# Patient Record
Sex: Male | Born: 1970 | Race: Black or African American | Hispanic: No | Marital: Single | Smoking: Current every day smoker
Health system: Southern US, Community
[De-identification: ages and names within clinical notes are randomized; demographics above are authoritative.]

---

## 2004-09-16 ENCOUNTER — Emergency Department (HOSPITAL_COMMUNITY): Admission: EM | Admit: 2004-09-16 | Discharge: 2004-09-16 | Payer: Self-pay | Admitting: Emergency Medicine

## 2015-05-20 ENCOUNTER — Encounter (HOSPITAL_COMMUNITY): Payer: Self-pay | Admitting: Emergency Medicine

## 2015-05-20 ENCOUNTER — Emergency Department (HOSPITAL_COMMUNITY): Payer: Self-pay

## 2015-05-20 ENCOUNTER — Emergency Department (HOSPITAL_COMMUNITY)
Admission: EM | Admit: 2015-05-20 | Discharge: 2015-05-20 | Disposition: A | Discharge status: Home / Self Care | Payer: Self-pay | Attending: Physician Assistant | Admitting: Physician Assistant

## 2015-05-20 DIAGNOSIS — R231 Pallor: Secondary | ICD-10-CM | POA: Insufficient documentation

## 2015-05-20 DIAGNOSIS — R000 Tachycardia, unspecified: Secondary | ICD-10-CM | POA: Insufficient documentation

## 2015-05-20 DIAGNOSIS — B349 Viral infection, unspecified: Secondary | ICD-10-CM | POA: Insufficient documentation

## 2015-05-20 DIAGNOSIS — R42 Dizziness and giddiness: Secondary | ICD-10-CM | POA: Insufficient documentation

## 2015-05-20 DIAGNOSIS — F17200 Nicotine dependence, unspecified, uncomplicated: Secondary | ICD-10-CM | POA: Insufficient documentation

## 2015-05-20 DIAGNOSIS — N390 Urinary tract infection, site not specified: Secondary | ICD-10-CM | POA: Insufficient documentation

## 2015-05-20 LAB — COMPREHENSIVE METABOLIC PANEL
ALBUMIN: 3.6 g/dL (ref 3.5–5.0)
ALK PHOS: 73 U/L (ref 38–126)
ALT: 24 U/L (ref 17–63)
ANION GAP: 10 (ref 5–15)
AST: 19 U/L (ref 15–41)
BILIRUBIN TOTAL: 0.6 mg/dL (ref 0.3–1.2)
BUN: 8 mg/dL (ref 6–20)
CALCIUM: 9.3 mg/dL (ref 8.9–10.3)
CO2: 25 mmol/L (ref 22–32)
Chloride: 105 mmol/L (ref 101–111)
Creatinine, Ser: 1.09 mg/dL (ref 0.61–1.24)
GFR calc Af Amer: >60 mL/min (ref >60)
GLUCOSE: 93 mg/dL (ref 65–99)
POTASSIUM: 3.4 mmol/L — AB (ref 3.5–5.1)
SODIUM: 140 mmol/L (ref 135–145)
TOTAL PROTEIN: 8 g/dL (ref 6.5–8.1)

## 2015-05-20 LAB — URINE MICROSCOPIC-ADD ON

## 2015-05-20 LAB — CBC
HEMATOCRIT: 43.9 % (ref 39.0–52.0)
HEMOGLOBIN: 14.7 g/dL (ref 13.0–17.0)
MCH: 30.8 pg (ref 26.0–34.0)
MCHC: 33.5 g/dL (ref 30.0–36.0)
MCV: 91.8 fL (ref 78.0–100.0)
Platelets: 291 10*3/uL (ref 150–400)
RBC: 4.78 MIL/uL (ref 4.22–5.81)
RDW: 12.9 % (ref 11.5–15.5)
WBC: 11.6 10*3/uL — AB (ref 4.0–10.5)

## 2015-05-20 LAB — URINALYSIS, ROUTINE W REFLEX MICROSCOPIC
Glucose, UA: NEGATIVE mg/dL
Hgb urine dipstick: NEGATIVE
KETONES UR: 15 mg/dL — AB
NITRITE: POSITIVE — AB
PH: 5.5 (ref 5.0–8.0)
Protein, ur: 100 mg/dL — AB
Specific Gravity, Urine: 1.031 — ABNORMAL HIGH (ref 1.005–1.030)

## 2015-05-20 LAB — LIPASE, BLOOD: LIPASE: 28 U/L (ref 11–51)

## 2015-05-20 MED ORDER — SODIUM CHLORIDE 0.9 % IV BOLUS (SEPSIS)
1000.0000 mL | Freq: Once | INTRAVENOUS | Status: AC
  Administered 2015-05-20: 1000 mL via INTRAVENOUS

## 2015-05-20 MED ORDER — ONDANSETRON HCL 4 MG/2ML IJ SOLN
INTRAMUSCULAR | Status: AC
  Filled 2015-05-20: qty 2

## 2015-05-20 MED ORDER — ONDANSETRON 4 MG PO TBDP: 4.0000 mg | ORAL_TABLET | Freq: Three times a day (TID) | ORAL | Status: DC | PRN

## 2015-05-20 MED ORDER — CEPHALEXIN 500 MG PO CAPS: 500.0000 mg | ORAL_CAPSULE | Freq: Two times a day (BID) | ORAL | Status: DC

## 2015-05-20 MED ORDER — ONDANSETRON HCL 4 MG/2ML IJ SOLN
4.0000 mg | Freq: Once | INTRAMUSCULAR | Status: AC
  Administered 2015-05-20: 4 mg via INTRAVENOUS
  Filled 2015-05-20: qty 2

## 2015-05-20 NOTE — Discharge Instructions (Signed)
You were evaluated in the emergency room today for nausea, vomiting, dizziness, and cough. I suspect your symptoms are due to a virus. Your labs and chest x-ray are unremarkable. Your urine did show sign of urinary tract infection so I will give you a 5 day course of Keflex, an antibiotic. Please follow-up with urology (phone number in this packet) if you start experiencing any urinary symptoms even after antibiotic treatment. I will also give you a prescription for Zofran as needed for nausea. Please follow-up with your primary care provider within one week. If you do not have one you can call one of the offices listed below. Return to the ER for new or worsening symptoms.   Emergency Department Resource Guide 1) Find a Doctor and Pay Out of Pocket Although you won't have to find out who is covered by your insurance plan, it is a good idea to ask around and get recommendations. You will then need to call the office and see if the doctor you have chosen will accept you as a new patient and what types of options they offer for patients who are self-pay. Some doctors offer discounts or will set up payment plans for their patients who do not have insurance, but you will need to ask so you aren't surprised when you get to your appointment.  2) Contact Your Local Health Department Not all health departments have doctors that can see patients for sick visits, but many do, so it is worth a call to see if yours does. If you don't know where your local health department is, you can check in your phone book. The CDC also has a tool to help you locate your state's health department, and many state websites also have listings of all of their local health departments.  3) Find a Walk-in Clinic If your illness is not likely to be very severe or complicated, you may want to try a walk in clinic. These are popping up all over the country in pharmacies, drugstores, and shopping centers. They're usually staffed by nurse  practitioners or physician assistants that have been trained to treat common illnesses and complaints. They're usually fairly quick and inexpensive. However, if you have serious medical issues or chronic medical problems, these are probably not your best option.  No Primary Care Doctor: - Call Health Connect at  [phone] - they can help you locate a primary care doctor that  accepts your insurance, provides certain services, etc. - Physician Referral [phone]- [phone]  Emergency Department Resource Guide 1) Find a Doctor and Pay Out of Pocket Although you won't have to find out who is covered by your insurance plan, it is a good idea to ask around and get recommendations. You will then need to call the office and see if the doctor you have chosen will accept you as a new patient and what types of options they offer for patients who are self-pay. Some doctors offer discounts or will set up payment plans for their patients who do not have insurance, but you will need to ask so you aren't surprised when you get to your appointment.  2) Contact Your Local Health Department Not all health departments have doctors that can see patients for sick visits, but many do, so it is worth a call to see if yours does. If you don't know where your local health department is, you can check in your phone book. The CDC also has a tool to help you locate your state's health  department, and many state websites also have listings of all of their local health departments.  3) Find a [facility] If your illness is not likely to be very severe or complicated, you may want to try a walk in clinic. These are popping up all over the country in pharmacies, drugstores, and shopping centers. They're usually staffed by nurse practitioners or physician assistants that have been trained to treat common illnesses and complaints. They're usually fairly quick and inexpensive. However, if you have serious medical issues or chronic  medical problems, these are probably not your best option.  No Primary Care Doctor: - Call Health Connect at  [phone] - they can help you locate a primary care doctor that  accepts your insurance, provides certain services, etc. - Physician Referral Service- [phone]  Chronic Pain Problems: Organization         Address  Phone   Notes  [facility]  [phone] Patients need to be referred by their primary care doctor.   Medication Assistance: Organization         Address  Phone   Notes  [facility] Medication [facility] [address] [place], [place] [address] [phone] --Must be a resident of [facility] -- Must have NO insurance coverage whatsoever (no Medicaid/ Medicare, etc.) -- The pt. MUST have a primary care doctor that directs their care regularly and follows them in the community   [facility]  [phone]   [employer]  [phone]    Agencies that provide inexpensive medical care: Organization         Address  Phone   Notes  [place]  [phone]   [facility]    [phone]   [facility] [place], [place] [address] [phone]   [place] [address], [place] [phone]   [facility]    [phone]   [facility]    [phone]   [place] [address], [place] Phone:  [phone], Fax:  [phone] Hours of Operation:  9 am - 6 pm, M-F.  Also accepts Medicaid/Medicare and self-pay.  [facility] for [place] [address], [address], [place] Phone: [phone], Fax: [phone]. Hours of Operation:  8:30 am - 5:30 pm, M-F.  Also accepts Medicaid and self-pay.  [facility] [place] [address], [place] Phone: [phone]   [place], [place], [place] [phone],  Ext. [phone] Mondays & Thursdays: 7-9 AM.  First 15 patients are seen on a first come, first serve basis.    [place] Providers:  Organization         Address  Phone   Notes  [facility] [address], [place] [phone] Also accepts self-pay patients.  [facility] [address], [place]  [phone]   [place], [address], [place] [phone]   [facility] [address], [place] [phone]   Lucianne Lei [address], [place]   [phone] Only accepts [place] [facility] [place] patients after they have their name applied to their card.   Self-Pay (no insurance) in [facility]:  Organization  Address  Phone   Notes  Sickle Cell Patients, [facility] [place] [phone]   [facility] [place] [phone]   [facility] [place]  [address], [address], [place] [phone]   [facility]/Dr. Osei-Bonsu  [address] [place] or [address], [place] [phone] Phone number for both [place] and [place] locations is the same.  [facility] and [facility] [address], [place] [phone]   [facility] [address] [place] or [address] [phone]   [facility] [address], [place] [phone], phone; [phone], fax Sees patients 1st and 3rd Saturday of every month.  Must not qualify for public or private insurance (i.e. Medicaid, Medicare, [place] Health Choice, Veterans' Benefits)  Household income should be no more than 200% of the poverty level The clinic cannot treat you if you are pregnant or think you are pregnant  Sexually transmitted diseases are not  treated at the clinic.

## 2015-05-20 NOTE — ED Notes (Signed)
Pt sts N/V x 3 days with some dizziness x 2 days; pt sts unable to hold down POs and having body aches

## 2015-05-20 NOTE — ED Provider Notes (Signed)
CSN: [id number]     Arrival date & time 05/20/15  [id number] History   First MD Initiated Contact with Patient 05/20/15 1056     Chief Complaint  Patient presents with  . Emesis  . Dizziness    HPI   [name] is an 44 y.o. male with no significant PMH who presents to the ED for evaluation of N/V, dizziness, and cough. He states that starting on 11/23 he has felt very sick. States that he has had a nonproductive cough, body aches, and N/V. States he has been able to keep fluids down but has not had an appetite and has not really eaten in a few days. He states that has had 3-5 episodes of NBNB emesis a day. Denies diarrhea. States that he has felt very weak as well. He reports he was getting ready for work this AM and started feeling very dizzy. Denies syncope. Denies chest pain. He does endorse mild SOB that started today. He is maintaining good SpO2 on room air and does not appear to have labored breathing. He is unsure if he has had fever at home. Denies sick contacts. Denies abdominal pain, dysuria, urinary frequency/urgency. Admits to smoking 1/2 PPD. Denies EtOH, MJ , or other drug use. Reports he has not had his flu shot this year.  History reviewed. No pertinent past medical history. History reviewed. No pertinent past surgical history. History reviewed. No pertinent family history. Social History  Substance Use Topics  . Smoking status: Current Every Day Smoker  . Smokeless tobacco: None  . Alcohol Use: Yes    Review of Systems  All other systems reviewed and are negative.     Allergies  Review of patient's allergies indicates no known allergies.  Home Medications   Prior to Admission medications   Not on File   BP 144/105 mmHg  Pulse 102  Temp(Src) 97.9 F (36.6 C) (Oral)  Resp 18  SpO2 100% Physical Exam  Constitutional: He is oriented to person, place, and time. No distress.  HENT:  Right Ear: External ear normal.  Left Ear: External ear normal.  Nose: Nose normal.   Mouth/Throat: Oropharynx is clear and moist. Mucous membranes are dry. No oropharyngeal exudate.  Eyes: Conjunctivae and EOM are normal. Pupils are equal, round, and reactive to light.  Neck: Normal range of motion. Neck supple.  Cardiovascular: Regular rhythm, normal heart sounds and intact distal pulses.  Tachycardia present.   Pulmonary/Chest: Effort normal and breath sounds normal. No respiratory distress. He has no wheezes.  Abdominal: Soft. Bowel sounds are normal. He exhibits no distension. There is no tenderness. There is no rebound and no guarding.  Musculoskeletal: Normal range of motion. He exhibits no edema or tenderness.  Neurological: He is alert and oriented to person, place, and time. He has normal strength. No cranial nerve deficit or sensory deficit.  Skin: Skin is warm and dry. He is not diaphoretic. There is pallor.  Psychiatric: He has a normal mood and affect.  Nursing note and vitals reviewed.   ED Course  Procedures (including critical care time) Labs Review Labs Reviewed  COMPREHENSIVE METABOLIC PANEL - Abnormal; Notable for the following:    Potassium 3.4 (*)    All other components within normal limits  CBC - Abnormal; Notable for the following:    WBC 11.6 (*)    All other components within normal limits  URINALYSIS, ROUTINE W REFLEX MICROSCOPIC (NOT AT [facility]) - Abnormal; Notable for the following:    Color,  Urine ORANGE (*)    Specific Gravity, Urine 1.031 (*)    Bilirubin Urine MODERATE (*)    Ketones, ur 15 (*)    Protein, ur 100 (*)    Nitrite POSITIVE (*)    Leukocytes, UA TRACE (*)    All other components within normal limits  URINE MICROSCOPIC-ADD ON - Abnormal; Notable for the following:    Squamous Epithelial / LPF 0-5 (*)    Bacteria, UA FEW (*)    All other components within normal limits  URINE CULTURE  LIPASE, BLOOD    Imaging Review Dg Chest 2 View  05/20/2015  CLINICAL DATA:  Productive cough EXAM: CHEST  2 VIEW COMPARISON:  None.  FINDINGS: The heart size and mediastinal contours are within normal limits. Both lungs are clear. The visualized skeletal structures are unremarkable. IMPRESSION: No active cardiopulmonary disease. Electronically Signed   By: Marlan Palau M.D.   On: 05/20/2015 12:36   I have personally reviewed and evaluated these images and lab results as part of my medical decision-making.   EKG Interpretation None      MDM   Final diagnoses:  Urinary tract infection without hematuria, site unspecified  Viral syndrome    Pt initially tachycardic which I suspect is likely 2/2 hypovolemia and poor PO intake. He is afebrile in the ED today.  I suspect viral illness. I suspect pt's weakness and dizzness also 2/2 viral illness and hypovolemia/poor PO intake. However given duration of symptoms, tachycardia, and clinical presentation will get CXR to r/o pneumonia or other cardiopulmonary pathology. Will check CBC, CMP, UA. Started on 1L bolus and zofran. BP initially high to 144/105 but during our exam in the room BP down to 130s/70s.  HR down to 80s after liter bolus. Pt slightly hypertensive again. No e/o end organ damage. Instructed to f/u with PCP. In the ED he states he feels much improved. K+ slightly low at 3.4 which I suspect is secondary to GI losses. White count slightly high at 11.6. UA does show postiive nitrites and trace leuks but pt denies any dysuria, urinary frequency/urgency, penile discharge, GU pain. Will give 5 day course of Keflex. Will give info for uro f/u. Will send urine for culture. ER return precautions given.   Carlene Coria, PA-C 05/21/15 1610  Courteney Randall An, MD 05/22/15 [phone]

## 2015-05-21 LAB — URINE CULTURE: CULTURE: NO GROWTH

## 2015-08-04 ENCOUNTER — Encounter (HOSPITAL_COMMUNITY): Payer: Self-pay | Admitting: *Deleted

## 2015-08-04 ENCOUNTER — Emergency Department (HOSPITAL_COMMUNITY)
Admission: EM | Admit: 2015-08-04 | Discharge: 2015-08-04 | Disposition: A | Discharge status: Home / Self Care | Payer: Self-pay | Attending: Emergency Medicine | Admitting: Emergency Medicine

## 2015-08-04 ENCOUNTER — Emergency Department (HOSPITAL_COMMUNITY): Payer: Self-pay

## 2015-08-04 DIAGNOSIS — Y9289 Other specified places as the place of occurrence of the external cause: Secondary | ICD-10-CM | POA: Insufficient documentation

## 2015-08-04 DIAGNOSIS — Z23 Encounter for immunization: Secondary | ICD-10-CM | POA: Insufficient documentation

## 2015-08-04 DIAGNOSIS — F17200 Nicotine dependence, unspecified, uncomplicated: Secondary | ICD-10-CM | POA: Insufficient documentation

## 2015-08-04 DIAGNOSIS — S99922A Unspecified injury of left foot, initial encounter: Secondary | ICD-10-CM | POA: Insufficient documentation

## 2015-08-04 DIAGNOSIS — W450XXA Nail entering through skin, initial encounter: Secondary | ICD-10-CM | POA: Insufficient documentation

## 2015-08-04 DIAGNOSIS — Y998 Other external cause status: Secondary | ICD-10-CM | POA: Insufficient documentation

## 2015-08-04 DIAGNOSIS — Y9389 Activity, other specified: Secondary | ICD-10-CM | POA: Insufficient documentation

## 2015-08-04 LAB — CBG MONITORING, ED: Glucose-Capillary: 92 mg/dL (ref 65–99)

## 2015-08-04 MED ORDER — CIPROFLOXACIN HCL 500 MG PO TABS: 500.0000 mg | ORAL_TABLET | Freq: Two times a day (BID) | ORAL | Status: DC

## 2015-08-04 MED ORDER — TETANUS-DIPHTH-ACELL PERTUSSIS 5-2.5-18.5 LF-MCG/0.5 IM SUSP
0.5000 mL | Freq: Once | INTRAMUSCULAR | Status: AC
  Administered 2015-08-04: 0.5 mL via INTRAMUSCULAR
  Filled 2015-08-04: qty 0.5

## 2015-08-04 NOTE — ED Notes (Signed)
Pt reports he stepped on a nail 2 weeks ago. Pt has a darkend skin area on the ball of LT foot.

## 2015-08-04 NOTE — Discharge Instructions (Signed)
Is important for you to take all of your antibiotics as prescribed. Do not save or share them. Please follow-up with your doctor in 1 week for a wound recheck. Return to ED for any new or worsening symptoms.

## 2015-08-04 NOTE — ED Notes (Signed)
Declined W/C at D/C and was escorted to lobby by RN. 

## 2015-08-04 NOTE — ED Provider Notes (Signed)
CSN: [id number]     Arrival date & time 08/04/15  1212 History  By signing my name below, I, Iona Beard, attest that this documentation has been prepared under the direction and in the presence of Velna Hatchet, PA-C.  Electronically Signed: Iona Beard, ED Scribe 08/04/2015 at 2:24 PM.    Chief Complaint  Patient presents with  . Foot Injury    The history is provided by the patient. No language interpreter was used.   HPI Comments: Todd Christian is a 45 y.o. male who presents to the Emergency Department complaining of sudden onset, constant foot pain, onset after he stepped on a nail a few weeks ago at work. Pt states that the nail did not go through his foot but did puncture his foot. He notes that the area is still mildly painful and has noticed his skin darkening. No worsening or alleviating factors noted. Pt denies SOB, jaw pain, neck pain, fever, chills, and any other associated symptoms.    History reviewed. No pertinent past medical history. History reviewed. No pertinent past surgical history. History reviewed. No pertinent family history. Social History  Substance Use Topics  . Smoking status: Current Every Day Smoker  . Smokeless tobacco: None  . Alcohol Use: Yes    Review of Systems  Constitutional: Negative for chills.  Respiratory: Negative for shortness of breath.   Musculoskeletal: Negative for gait problem and neck stiffness.  Skin: Positive for color change and wound.  Neurological: Negative for dizziness and headaches.  All other systems reviewed and are negative.    Allergies  Review of patient's allergies indicates no known allergies.  Home Medications   Prior to Admission medications   Medication Sig Start Date End Date Taking? Authorizing Provider  cephALEXin (KEFLEX) 500 MG capsule Take 1 capsule (500 mg total) by mouth 2 (two) times daily. 05/20/15  Yes Ace Gins Sam, PA-C  ondansetron (ZOFRAN ODT) 4 MG disintegrating tablet Take 1  tablet (4 mg total) by mouth every 8 (eight) hours as needed for nausea or vomiting. 05/20/15  Yes Ace Gins Sam, PA-C  ciprofloxacin (CIPRO) 500 MG tablet Take 1 tablet (500 mg total) by mouth 2 (two) times daily. 08/04/15   Joycie Peek, PA-C   BP 129/85 mmHg  Pulse 77  Temp(Src) 98.2 F (36.8 C) (Oral)  Ht 5' 9.5" (1.765 m)  Wt 153 lb 8 oz (69.627 kg)  BMI 22.35 kg/m2  SpO2 100% Physical Exam  Constitutional: He appears well-developed and well-nourished. No distress.  HENT:  Head: Normocephalic and atraumatic.  Eyes: Conjunctivae and EOM are normal.  Neck: Neck supple. No tracheal deviation present.  Cardiovascular: Normal rate, regular rhythm and normal heart sounds.   Pulmonary/Chest: Effort normal. No respiratory distress.  Musculoskeletal: Normal range of motion. He exhibits tenderness.       Left ankle: He exhibits no swelling, no deformity, no laceration and normal pulse. Tenderness.  Plantar aspect of left foot contains area of callused skin with mildly darker skin pigmentation, .5 cm in lenth.  Mild TTP to area.  Distal pulses intact. No other swelling or erythema noted.   Neurological: He is alert.  Gait baseline  Skin: Skin is warm and dry.  Psychiatric: He has a normal mood and affect. His behavior is normal.    ED Course  Procedures (including critical care time) DIAGNOSTIC STUDIES: Oxygen Saturation is 100% on RA, normal by my interpretation.    COORDINATION OF CARE: 1:33 PM-Discussed treatment plan which includes Tdap injection  and DG left foot complete with pt at bedside and pt agreed to plan.   Labs Review Labs Reviewed  CBG MONITORING, ED    Imaging Review Dg Foot Complete Left  08/04/2015  CLINICAL DATA:  Sudden onset of pain after stepping on a nail several weeks ago at work. EXAM: LEFT FOOT - COMPLETE 3+ VIEW COMPARISON:  None. FINDINGS: There is no evidence of fracture or dislocation. There is no evidence of arthropathy or other focal bone  abnormality. Soft tissues are unremarkable. No radiopaque foreign object. IMPRESSION: Normal radiographs. Electronically Signed   By: Paulina Fusi M.D.   On: 08/04/2015 14:17   I have personally reviewed and evaluated these images and lab results as part of my medical decision-making.   EKG Interpretation None     Meds given in ED:  Medications  Tdap (BOOSTRIX) injection 0.5 mL (0.5 mLs Intramuscular Given 08/04/15 1501)    Discharge Medication List as of 08/04/2015  2:47 PM    START taking these medications   Details  ciprofloxacin (CIPRO) 500 MG tablet Take 1 tablet (500 mg total) by mouth 2 (two) times daily., Starting 08/04/2015, Until Discontinued, Print       Filed Vitals:   08/04/15 1304  BP: 129/85  Pulse: 77  Temp: 98.2 F (36.8 C)  TempSrc: Oral  Height: 5' 9.5" (1.765 m)  Weight: 69.627 kg  SpO2: 100%    MDM  Todd Christian is a 45 y.o. male with no significant past medical history comes in for evaluation after stepping on a nail 2 weeks ago. Nail went through his shoe. He reports only minor penetration into his foot. Tetanus updated in the ED. No evidence of infection or other cellulitis on exam. However, we will empirically initiate Cipro. Follow up with PCP for wound recheck in 3 or 4 days. Patient verbalizes understanding and agrees with this plan. Pulse is no other questions or concerns at this time. Hemodynamically stable, afebrile and appropriate for discharge. Ambulates out of the ED without difficulty. Final diagnoses:  Foot injury, left, initial encounter   I personally performed the services described in this documentation, which was scribed in my presence. The recorded information has been reviewed and is accurate.    Joycie Peek, PA-C 08/04/15 1545  Arby Barrette, MD 08/06/15 1700

## 2015-09-22 ENCOUNTER — Emergency Department (HOSPITAL_COMMUNITY)
Admission: EM | Admit: 2015-09-22 | Discharge: 2015-09-22 | Disposition: A | Discharge status: Home / Self Care | Payer: Self-pay | Attending: Emergency Medicine | Admitting: Emergency Medicine

## 2015-09-22 ENCOUNTER — Encounter (HOSPITAL_COMMUNITY): Payer: Self-pay | Admitting: *Deleted

## 2015-09-22 ENCOUNTER — Emergency Department (HOSPITAL_COMMUNITY): Payer: Self-pay

## 2015-09-22 DIAGNOSIS — Z792 Long term (current) use of antibiotics: Secondary | ICD-10-CM | POA: Insufficient documentation

## 2015-09-22 DIAGNOSIS — F17200 Nicotine dependence, unspecified, uncomplicated: Secondary | ICD-10-CM | POA: Insufficient documentation

## 2015-09-22 DIAGNOSIS — L03011 Cellulitis of right finger: Secondary | ICD-10-CM | POA: Insufficient documentation

## 2015-09-22 MED ORDER — LIDOCAINE HCL (PF) 2 % IJ SOLN: 0.0000 mL | Freq: Once | INTRAMUSCULAR | Status: DC | PRN

## 2015-09-22 MED ORDER — SULFAMETHOXAZOLE-TRIMETHOPRIM 800-160 MG PO TABS: 1.0000 | ORAL_TABLET | Freq: Two times a day (BID) | ORAL | Status: AC

## 2015-09-22 MED ORDER — CEPHALEXIN 500 MG PO CAPS: 500.0000 mg | ORAL_CAPSULE | Freq: Two times a day (BID) | ORAL | Status: DC

## 2015-09-22 MED ORDER — LIDOCAINE HCL (PF) 1 % IJ SOLN
5.0000 mL | Freq: Once | INTRAMUSCULAR | Status: AC
  Administered 2015-09-22: 5 mL via INTRADERMAL
  Filled 2015-09-22: qty 5

## 2015-09-22 NOTE — ED Provider Notes (Signed)
CSN: [id number]     Arrival date & time 09/22/15  1509 History  By signing my name below, I, Todd Christian, attest that this documentation has been prepared under the direction and in the presence of [employer], PA-C. Electronically Signed: Tanda Christian, ED Scribe. 09/22/2015. 3:52 PM.   Chief Complaint  Patient presents with  . Finger Injury   The history is provided by the patient. No language interpreter was used.     HPI Comments: Todd Christian is a 45 y.o. male who is left hand dominant, presents to the Emergency Department complaining of gradual onset, constant, worsening, pain and swelling to right index finger x 1 week. Pt reports that he may have cut his right index finger on a box at work last week but is unsure. He has not taken anything for the pain. Denies weakness, numbness, tingling, fever, chills, or any other associated symptoms.    History reviewed. No pertinent past medical history. History reviewed. No pertinent past surgical history. History reviewed. No pertinent family history. Social History  Substance Use Topics  . Smoking status: Current Every Day Smoker  . Smokeless tobacco: None  . Alcohol Use: Yes    Review of Systems  Constitutional: Negative for fever and chills.  Musculoskeletal: Positive for joint swelling and arthralgias (right index finger).  Neurological: Negative for weakness and numbness.   Allergies  Review of patient's allergies indicates no known allergies.  Home Medications   Prior to Admission medications   Medication Sig Start Date End Date Taking? Authorizing Provider  cephALEXin (KEFLEX) 500 MG capsule Take 1 capsule (500 mg total) by mouth 2 (two) times daily. 09/22/15   Jermey Closs Patel-Mills, PA-C  ciprofloxacin (CIPRO) 500 MG tablet Take 1 tablet (500 mg total) by mouth 2 (two) times daily. 08/04/15   Joycie Peek, PA-C  ondansetron (ZOFRAN ODT) 4 MG disintegrating tablet Take 1 tablet (4 mg total) by mouth every 8 (eight)  hours as needed for nausea or vomiting. 05/20/15   Ace Gins Sam, PA-C  sulfamethoxazole-trimethoprim (BACTRIM DS,SEPTRA DS) 800-160 MG tablet Take 1 tablet by mouth 2 (two) times daily. 09/22/15 09/29/15  Shonique Pelphrey Patel-Mills, PA-C   BP 156/106 mmHg  Pulse 62  Temp(Src) 98.2 F (36.8 C) (Oral)  Resp 16  Ht  (1.753 m)  Wt 74.844 kg  BMI 24.36 kg/m2  SpO2 100%   Physical Exam  Constitutional: He is oriented to person, place, and time. He appears well-developed and well-nourished. No distress.  HENT:  Head: Normocephalic and atraumatic.  Eyes: Conjunctivae and EOM are normal.  Neck: Neck supple. No tracheal deviation present.  Cardiovascular: Normal rate.   Pulmonary/Chest: Effort normal. No respiratory distress.  Musculoskeletal: Normal range of motion. He exhibits edema and tenderness.  Swelling and tenderness to the right distal finger along the nailbed. No fluctuance. No subungual hematoma.   Neurological: He is alert and oriented to person, place, and time.  Skin: Skin is warm and dry.  Psychiatric: He has a normal mood and affect. His behavior is normal.  Nursing note and vitals reviewed.   ED Course  Procedures (including critical care time) NERVE BLOCK Performed by: Catha Gosselin Consent: Verbal consent obtained. Required items: required blood products, implants, devices, and special equipment available Time out: Immediately prior to procedure a "time out" was called to verify the correct patient, procedure, equipment, support staff and site/side marked as required. Indication: comfort and I&D Nerve block body site: right index finger Preparation: Patient was prepped and  draped in the usual sterile fashion. Needle gauge: 25 G Location technique: anatomical landmarks Local anesthetic: lidocaine 1% without epinepherine Anesthetic total: 3 ml Outcome: pain improved Patient tolerance: Patient tolerated the procedure well with no immediate complications.   INCISION  AND DRAINAGE Performed by: Catha GosselinPatel-Mills, Lavert Matousek Consent: Verbal consent obtained. Risks and benefits: risks, benefits and alternatives were discussed Type: abscess Body area: right index finger Anesthesia: local infiltration Incision was made with an 11 blade scalpel. Local anesthetic: digital block Complexity: simple Drainage: blood without purulent Drainage amount: 1mL Packing material: none Patient tolerance: Patient tolerated the procedure well with no immediate complications.    DIAGNOSTIC STUDIES: Oxygen Saturation is 100% on RA, normal by my interpretation.    COORDINATION OF CARE: 3:50 PM-Discussed treatment plan which includes DG R Index finger with pt at bedside and pt agreed to plan.   Labs Review Labs Reviewed - No data to display  Imaging Review Dg Finger Index Right  09/22/2015  CLINICAL DATA:  45 year old male with swollen finger in after pinching finger at work 1 week ago. EXAM: RIGHT INDEX FINGER 2+V COMPARISON:  None. FINDINGS: The finger is not marked although appears to be to be the index finger. Clinical correlation recommended. Focal lucency tuft of finger without adjacent fracture fragment noted. This may represent osteolysis from trauma. IMPRESSION: The finger is not marked although appears to be to be the index finger. Clinical correlation recommended. Focal lucency tuft of finger without adjacent fracture fragment noted. This may represent osteolysis from trauma. Electronically Signed   By: Lacy DuverneySteven  Olson M.D.   On: 09/22/2015 16:25   I have personally reviewed and evaluated these images as part of my medical decision-making.   EKG Interpretation None      MDM   Final diagnoses:  Paronychia of finger of right hand  Patient presents for right index finger pain near nailbed.  He has tenderness and swelling with discoloration.  I believe this is a paronychia.  He states he cut the finger previously but denies biting his nails.  He has been picking at the  wound.  Xray of finger shows local lucency atturft of finger without fracture.  May be osteolysis from trauma.   The wound was I&D'd but without purulent drainage.  Patient was put on antibiotics.  I discussed follow up with hand as well as strict return precautions.  Patient agrees with plan. Filed Vitals:   09/22/15 1528 09/22/15 1702  BP: 113/71 156/106  Pulse: 67 62  Temp: 98.2 F (36.8 C)   Resp: 16 16    I personally performed the services described in this documentation, which was scribed in my presence. The recorded information has been reviewed and is accurate.      Catha GosselinHanna Patel-Mills, PA-C 09/23/15 1321  Laurence Spatesachel Morgan Little, MD 09/24/15 [phone]

## 2015-09-22 NOTE — ED Notes (Signed)
Declined W/C at D/C and was escorted to lobby by RN. 

## 2015-09-22 NOTE — ED Notes (Signed)
PT reports he may have cut his Rt index finger on a box at work last week. Pt reports swelling to finger worse over night.

## 2015-09-22 NOTE — Discharge Instructions (Signed)
Paronychia Return for increased swelling or drainage. Paronychia is an infection of the skin that surrounds a nail. It usually affects the skin around a fingernail, but it may also occur near a toenail. It often causes pain and swelling around the nail. This condition may come on suddenly or develop over a longer period. In some cases, a collection of pus (abscess) can form near or under the nail. Usually, paronychia is not serious and it clears up with treatment. CAUSES This condition may be caused by bacteria or fungi. It is commonly caused by either Streptococcus or Staphylococcus bacteria. The bacteria or fungi often cause the infection by getting into the affected area through an opening in the skin, such as a cut or a hangnail. RISK FACTORS This condition is more likely to develop in:  People who get their hands wet often, such as those who work as Fish farm manager, bartenders, or nurses.  People who bite their fingernails or suck their thumbs.  People who trim their nails too short.  People who have hangnails or injured fingertips.  People who get manicures.  People who have diabetes. SYMPTOMS Symptoms of this condition include:  Redness and swelling of the skin near the nail.  Tenderness around the nail when you touch the area.  Pus-filled bumps under the cuticle. The cuticle is the skin at the base or sides of the nail.  Fluid or pus under the nail.  Throbbing pain in the area. DIAGNOSIS This condition is usually diagnosed with a physical exam. In some cases, a sample of pus may be taken from an abscess to be tested in a lab. This can help to determine what type of bacteria or fungi is causing the condition. TREATMENT Treatment for this condition depends on the cause and severity of the condition. If the condition is mild, it may clear up on its own in a few days. Your health care provider may recommend soaking the affected area in warm water a few times a day. When treatment is  needed, the options may include:  Antibiotic medicine, if the condition is caused by a bacterial infection.  Antifungal medicine, if the condition is caused by a fungal infection.  Incision and drainage, if an abscess is present. In this procedure, the health care provider will cut open the abscess so the pus can drain out. HOME CARE INSTRUCTIONS  Soak the affected area in warm water if directed to do so by your health care provider. You may be told to do this for 20 minutes, 2-3 times a day. Keep the area dry in between soakings.  Take medicines only as directed by your health care provider.  If you were prescribed an antibiotic medicine, finish all of it even if you start to feel better.  Keep the affected area clean.  Do not try to drain a fluid-filled bump yourself.  If you will be washing dishes or performing other tasks that require your hands to get wet, wear rubber gloves. You should also wear gloves if your hands might come in contact with irritating substances, such as cleaners or chemicals.  Follow your health care provider's instructions about:  Wound care.  Bandage (dressing) changes and removal. SEEK MEDICAL CARE IF:  Your symptoms get worse or do not improve with treatment.  You have a fever or chills.  You have redness spreading from the affected area.  You have continued or increased fluid, blood, or pus coming from the affected area.  Your finger or knuckle becomes  swollen or is difficult to move.   This information is not intended to replace advice given to you by your health care provider. Make sure you discuss any questions you have with your health care provider.   Document Released: 12/01/2000 Document Revised: 10/22/2014 Document Reviewed: 05/15/2014 Elsevier Interactive Patient Education [employer]2016 [employer]vier Inc.

## 2015-09-25 ENCOUNTER — Emergency Department (HOSPITAL_COMMUNITY)
Admission: EM | Admit: 2015-09-25 | Discharge: 2015-09-25 | Disposition: A | Discharge status: Home / Self Care | Payer: Self-pay | Attending: Emergency Medicine | Admitting: Emergency Medicine

## 2015-09-25 ENCOUNTER — Encounter (HOSPITAL_COMMUNITY): Payer: Self-pay | Admitting: Emergency Medicine

## 2015-09-25 DIAGNOSIS — Z5189 Encounter for other specified aftercare: Secondary | ICD-10-CM

## 2015-09-25 DIAGNOSIS — Z4800 Encounter for change or removal of nonsurgical wound dressing: Secondary | ICD-10-CM | POA: Insufficient documentation

## 2015-09-25 DIAGNOSIS — Z792 Long term (current) use of antibiotics: Secondary | ICD-10-CM | POA: Insufficient documentation

## 2015-09-25 DIAGNOSIS — F17200 Nicotine dependence, unspecified, uncomplicated: Secondary | ICD-10-CM | POA: Insufficient documentation

## 2015-09-25 DIAGNOSIS — R2231 Localized swelling, mass and lump, right upper limb: Secondary | ICD-10-CM | POA: Insufficient documentation

## 2015-09-25 NOTE — ED Notes (Signed)
Pt returns today for recheck of infection to right index finger.,  Pt st's finger feels better after taking antibiotic.  No drainage present at this time

## 2015-09-25 NOTE — Discharge Instructions (Signed)
Mr. Alvira Mondayimothy D Quinlivan,  Nice meeting you! Continue taking your antibiotics as prescribed. Please follow-up with Dr. Mina MarbleWeingold if you are not getting better. Call his office tomorrow and confirm your appointment for Monday, 09/29/15 at 12:30. Return to the emergency department if you develop fevers, chills, increased redness, inability to move your finger. Feel better soon!  S. Lane HackerNicole Lissandro Dilorenzo, PA-C

## 2015-09-25 NOTE — ED Notes (Signed)
Pt in from home reports having R index finger drained earlier this week & was told to come back for recheck, pt able to move fingers, no redness, drainage or swelling noted

## 2015-09-25 NOTE — ED Provider Notes (Signed)
CSN: [id number]     Arrival date & time 09/25/15  1612 History  By signing my name below, I, [name], attest that this documentation has been prepared under the direction and in the presence of Melton Krebs, PA-C. Electronically Signed: [name], ED Scribe. 09/25/2015. 4:38 PM.   Chief Complaint  Patient presents with  . Wound Check   The history is provided by the patient. No language interpreter was used.  HPI Comments: Todd Christian is a 45 y.o. male who presents to the Emergency Department requesting a wound check. Pt was seen 3 days ago to have his right index finger drained. He was told to follow up for a recheck. Pt reports relief with his antibiotics that were prescribed to him following the procedure. He denies redness, drainage, fever, chills, numbness, or weakness.  No past medical history on file. No past surgical history on file. No family history on file. Social History  Substance Use Topics  . Smoking status: Current Every Day Smoker  . Smokeless tobacco: Not on file  . Alcohol Use: Yes    Review of Systems  Constitutional: Negative for fever and chills.  Neurological: Negative for weakness and numbness.  All other systems reviewed and are negative.   Allergies  Review of patient's allergies indicates no known allergies.  Home Medications   Prior to Admission medications   Medication Sig Start Date End Date Taking? Authorizing Provider  cephALEXin (KEFLEX) 500 MG capsule Take 1 capsule (500 mg total) by mouth 2 (two) times daily. 09/22/15   Hanna Patel-Mills, PA-C  ciprofloxacin (CIPRO) 500 MG tablet Take 1 tablet (500 mg total) by mouth 2 (two) times daily. 08/04/15   Joycie Peek, PA-C  ondansetron (ZOFRAN ODT) 4 MG disintegrating tablet Take 1 tablet (4 mg total) by mouth every 8 (eight) hours as needed for nausea or vomiting. 05/20/15   Ace Gins Sam, PA-C  sulfamethoxazole-trimethoprim (BACTRIM DS,SEPTRA DS) 800-160 MG tablet Take 1  tablet by mouth 2 (two) times daily. 09/22/15 09/29/15  Hanna Patel-Mills, PA-C   BP 140/97 mmHg  Pulse 64  Temp(Src) 98 F (36.7 C) (Oral)  Resp 16  SpO2 100% Physical Exam  Constitutional: He is oriented to person, place, and time. He appears well-developed and well-nourished. No distress.  HENT:  Head: Normocephalic and atraumatic.  Mouth/Throat: Oropharynx is clear and moist. No oropharyngeal exudate.  Eyes: Conjunctivae and EOM are normal. Pupils are equal, round, and reactive to light.  Neck: Normal range of motion. Neck supple.  Musculoskeletal: Normal range of motion.  Mild erythema to the tip of the right index finger. Full ROM of finger; there is no swelling or drainage from the area.   Neurological: He is alert and oriented to person, place, and time.  Skin: Skin is warm and dry.  Psychiatric: He has a normal mood and affect. His behavior is normal.      ED Course  Procedures  DIAGNOSTIC STUDIES: Oxygen Saturation is 100% on RA, normal by my interpretation.    COORDINATION OF CARE: 4:34 PM-Discussed treatment plan with pt at bedside and pt agreed to plan.   MDM   Patient returns for check of  recent incision and drainage. The region appears to be well-healing and infection appears to be resolving. Patient symptoms improved from prior visit. Afebrile and hemodynamically stable. Pt is instructed to continue with home care or antibiotics. Pt has a good understanding of return precautions and is safe for discharge at this time.  Final diagnoses:  Encounter for wound re-check   Patient nontoxic appearing, VSS.  Called Dr. Carlos LeveringGramig's office as this was written in AVS instructions. Dr. Amanda PeaGramig was not aware of the patient but the patient may f/u with Gramig if he is not improving. Dr. Amanda PeaGramig said he can see him Monday at 12:30. Patient states he would like to follow-up with Dr. Amanda PeaGramig.  I personally performed the services described in this documentation, which was scribed in my  presence. The recorded information has been reviewed and is accurate.   Melton KrebsSamantha Nicole Pachia Strum, PA-C 09/29/15 [id number]  Pricilla LovelessScott Goldston, MD 10/07/15 1002

## 2016-09-27 ENCOUNTER — Ambulatory Visit (HOSPITAL_COMMUNITY)
Admission: EM | Admit: 2016-09-27 | Discharge: 2016-09-27 | Disposition: A | Discharge status: Home / Self Care | Payer: Self-pay | Attending: Internal Medicine | Admitting: Internal Medicine

## 2016-09-27 ENCOUNTER — Encounter (HOSPITAL_COMMUNITY): Payer: Self-pay | Admitting: Emergency Medicine

## 2016-09-27 DIAGNOSIS — R0981 Nasal congestion: Secondary | ICD-10-CM

## 2016-09-27 DIAGNOSIS — R42 Dizziness and giddiness: Secondary | ICD-10-CM

## 2016-09-27 LAB — GLUCOSE, CAPILLARY: Glucose-Capillary: 79 mg/dL (ref 65–99)

## 2016-09-27 MED ORDER — PREDNISONE 50 MG PO TABS: 50.0000 mg | ORAL_TABLET | Freq: Every day | ORAL | 0 refills | Status: DC

## 2016-09-27 MED ORDER — FLUTICASONE PROPIONATE 50 MCG/ACT NA SUSP: 2.0000 | Freq: Every day | NASAL | 0 refills | Status: DC

## 2016-09-27 MED ORDER — TRIAMCINOLONE ACETONIDE 55 MCG/ACT NA AERO: 2.0000 | INHALATION_SPRAY | Freq: Every day | NASAL | 0 refills | Status: DC

## 2016-09-27 NOTE — ED Triage Notes (Signed)
The patient presented to the [facility] with a complaint of dizziness and lightheadedness after taking nyquil and cold medicines. The patient denied any symptoms at this time.

## 2016-09-27 NOTE — ED Notes (Signed)
bp    137  /90   Pulse  64   Laying  bp 139/97  Pulse  69  Sitting   bp   126 /87     Standing  73

## 2016-09-27 NOTE — Discharge Instructions (Addendum)
Blood sugar today was 79.  Dizziness has many possible causes, including sinus congestion, decongestants, viral illnesses like the flu.  If symptoms have improved, no further action is needed at this time.  Note for work today.  Prescriptions for prednisone and nasal steroid spray sent to [facility].  Recheck as needed.

## 2016-09-27 NOTE — ED Provider Notes (Signed)
[facility]    CSN: [id number] Arrival date & time: 09/27/16  1459     History   Chief Complaint Chief Complaint  Patient presents with  . Medication Reaction    HPI Todd Christian is a 46 y.o. male. Presents with lightheadedness, improving to nearly resolved now.  Some residual cough after having the flu about a month ago.  Took Nyquil during worst of flu symptoms and was associated with dizziness.  Worried about whether dizziness represents diabetes.  Not falling down, no focal weakness/clumsiness of arms/legs.  No palpitations.  No CP, not short of breath.  Needs note for work today   HPI  History reviewed. No pertinent past medical history.  History reviewed. No pertinent surgical history.     Home Medications    Prior to Admission medications   Medication Sig Start Date End Date Taking? Authorizing Provider  fluticasone (FLONASE) 50 MCG/ACT nasal spray Place 2 sprays into both nostrils daily. 09/27/16   Eustace Moore, MD  predniSONE (DELTASONE) 50 MG tablet Take 1 tablet (50 mg total) by mouth daily. 09/27/16   Eustace Moore, MD  triamcinolone (NASACORT AQ) 55 MCG/ACT AERO nasal inhaler Place 2 sprays into the nose daily. 09/27/16   Eustace Moore, MD    Family History History reviewed. No pertinent family history.  Social History Social History  Substance Use Topics  . Smoking status: Current Every Day Smoker  . Smokeless tobacco: Not on file  . Alcohol use Yes     Allergies   Patient has no known allergies.   Review of Systems Review of Systems  All other systems reviewed and are negative.    Physical Exam Triage Vital Signs ED Triage Vitals  Enc Vitals Group     BP 09/27/16 1533 132/84     Pulse Rate 09/27/16 1533 67     Resp 09/27/16 1533 16     Temp 09/27/16 1533 98.1 F (36.7 C)     Temp Source 09/27/16 1533 Oral     SpO2 09/27/16 1533 100 %     Weight --      Height --      Pain Score 09/27/16 1531 0     Pain Loc --     Updated Vital Signs BP 132/84 (BP Location: Right Arm)   Pulse 67   Temp 98.1 F (36.7 C) (Oral)   Resp 16   SpO2 100%   Physical Exam  Constitutional: He is oriented to person, place, and time. No distress.  Alert, nicely groomed  HENT:  Head: Atraumatic.  B opaque TMs, no erythema Marked nasal congestion bilat Throat a little injected with post nasal drainage evident  Eyes:  Conjugate gaze, no eye redness/drainage  Neck: Neck supple.  Cardiovascular: Normal rate and regular rhythm.   Pulmonary/Chest: No respiratory distress. He has no wheezes. He has no rales.  Breath sounds somewhat coarse but symmetric throughout  Abdominal: He exhibits no distension.  Musculoskeletal: Normal range of motion.  Neurological: He is alert and oriented to person, place, and time.  Skin: Skin is warm and dry.  No cyanosis  Nursing note and vitals reviewed.    UC Treatments / Results  Labs Results for orders placed or performed during the hospital encounter of 09/27/16  Glucose, capillary  Result Value Ref Range   Glucose-Capillary 79 65 - 99 mg/dL    Procedures Procedures (including critical care time) Orthostatic vitals:  No orthostasis detected  Final Clinical Impressions(s) /  UC Diagnoses   Final diagnoses:  Dizziness  Sinus congestion   Blood sugar today was 79.  Dizziness has many possible causes, including sinus congestion, decongestants, viral illnesses like the flu.  If symptoms have improved, no further action is needed at this time.  Note for work today.  Prescriptions for prednisone and nasal steroid spray sent to [facility].  Recheck as needed.    Meds ordered this encounter  Medications  . predniSONE (DELTASONE) 50 MG tablet    Sig: Take 1 tablet (50 mg total) by mouth daily.    Dispense:  3 tablet    Refill:  0  . triamcinolone (NASACORT AQ) 55 MCG/ACT AERO nasal inhaler    Sig: Place 2 sprays into the nose daily.    Dispense:  1 Inhaler    Refill:  0       Eustace Moore, MD 09/29/16 2251

## 2017-01-11 ENCOUNTER — Encounter (HOSPITAL_COMMUNITY): Payer: Self-pay | Admitting: Emergency Medicine

## 2017-01-11 ENCOUNTER — Emergency Department (HOSPITAL_COMMUNITY)
Admission: EM | Admit: 2017-01-11 | Discharge: 2017-01-11 | Disposition: A | Discharge status: Home / Self Care | Payer: Self-pay | Attending: Emergency Medicine | Admitting: Emergency Medicine

## 2017-01-11 DIAGNOSIS — L905 Scar conditions and fibrosis of skin: Secondary | ICD-10-CM | POA: Insufficient documentation

## 2017-01-11 DIAGNOSIS — F17200 Nicotine dependence, unspecified, uncomplicated: Secondary | ICD-10-CM | POA: Insufficient documentation

## 2017-01-11 DIAGNOSIS — Z79899 Other long term (current) drug therapy: Secondary | ICD-10-CM | POA: Insufficient documentation

## 2017-01-11 NOTE — ED Provider Notes (Signed)
[facility] Provider Note   CSN: [id number] Arrival date & time: 01/11/17  1035     History   Chief Complaint Chief Complaint  Patient presents with  . Foot Injury    HPI Todd Christian is a 46 y.o. male.  HPI   Todd Christian is a 46 y.o. male, patient with no pertinent past medical history, presenting to the ED with a "knot" on the base of the left foot for over a year. Patient states he stepped on a nail in February 2017. Patient was evaluated at that time and x-rays were negative for bony abnormality or foreign body. When the area healed over, the skin became tough and protruded from the foot slightly. This area is mildly uncomfortable when patient is walking. No pain or discomfort without weightbearing. Patient denies numbness, tingling, weakness, erythema, fever, exudate, or any other complaints.     History reviewed. No pertinent past medical history.  There are no active problems to display for this patient.   History reviewed. No pertinent surgical history.     Home Medications    Prior to Admission medications   Medication Sig Start Date End Date Taking? Authorizing Provider  fluticasone (FLONASE) 50 MCG/ACT nasal spray Place 2 sprays into both nostrils daily. 09/27/16   Eustace MooreMurray, Laura W, MD  ibuprofen (ADVIL,MOTRIN) 600 MG tablet Take 1 tablet (600 mg total) by mouth every 6 (six) hours as needed for mild pain or moderate pain. 01/12/17   Lavera GuiseLiu, Dana Duo, MD  predniSONE (DELTASONE) 50 MG tablet Take 1 tablet (50 mg total) by mouth daily. 09/27/16   Eustace MooreMurray, Laura W, MD  triamcinolone (NASACORT AQ) 55 MCG/ACT AERO nasal inhaler Place 2 sprays into the nose daily. 09/27/16   Eustace MooreMurray, Laura W, MD    Family History History reviewed. No pertinent family history.  Social History Social History  Substance Use Topics  . Smoking status: Current Every Day Smoker    Packs/day: 0.50  . Smokeless tobacco: Never Used  . Alcohol use Yes     Allergies   Patient has  no known allergies.   Review of Systems Review of Systems   Physical Exam Updated Vital Signs BP (!) 148/106   Pulse 70   Temp 98.6 F (37 C) (Oral)   Resp 17   SpO2 100%   Physical Exam  Constitutional: He appears well-developed and well-nourished. No distress.  HENT:  Head: Normocephalic and atraumatic.  Eyes: Conjunctivae are normal.  Neck: Neck supple.  Cardiovascular: Normal rate and regular rhythm.   Pulmonary/Chest: Effort normal.  Musculoskeletal: He exhibits tenderness. He exhibits no edema or deformity.  Patient has a small, raised area of what appears to be scar tissue that resembles a callus at the midpoint of the ball of the foot. Minimal tenderness. No erythema, swelling, or area of fluctuance.  Neurological: He is alert.  No sensory deficits noted. Strength in the toes of the left foot is 5/5.  Skin: Skin is warm and dry. He is not diaphoretic.  Psychiatric: He has a normal mood and affect. His behavior is normal.  Nursing note and vitals reviewed.    ED Treatments / Results  Labs (all labs ordered are listed, but only abnormal results are displayed) Labs Reviewed - No data to display  EKG  EKG Interpretation None       Radiology   Procedures Procedures (including critical care time)  Medications Ordered in ED Medications - No data to display   Initial Impression /  Assessment and Plan / ED Course  I have reviewed the triage vital signs and the nursing notes.  Pertinent labs & imaging results that were available during my care of the patient were reviewed by me and considered in my medical decision making (see chart for details).     Patient presents with an area of scar tissue to the left foot. Podiatry follow-up as needed. The patient was given instructions for home care as well as return precautions. Patient voices understanding of these instructions, accepts the plan, and is comfortable with discharge.  Final Clinical Impressions(s) /  ED Diagnoses   Final diagnoses:  Scar of foot    New Prescriptions Discharge Medication List as of 01/11/2017 12:31 PM       Anselm Pancoast, PA-C 01/12/17 1444    Azalia Bilis, MD 01/13/17 2328

## 2017-01-11 NOTE — Discharge Instructions (Signed)
The area in question on your foot resembles scar tissue or a callus. Todd Christian rubbed a callus down until even with the skin. Apply ointments such as Aquaphor twice daily to keep the skin flexible and supple. Should the issue persists, follow up with podiatry. Call the number provided to set up an appointment.

## 2017-01-11 NOTE — ED Triage Notes (Signed)
Pt states 6 mo ago he stepped on a nail. He received a tetanus shot and antibiotics that day. Pt states his dog ate the antibiotics. Pt states a "knot" came up where he stepped on the nail and the area is red. No drainage.

## 2017-01-12 ENCOUNTER — Encounter (HOSPITAL_COMMUNITY): Payer: Self-pay | Admitting: *Deleted

## 2017-01-12 ENCOUNTER — Emergency Department (HOSPITAL_COMMUNITY): Payer: Self-pay

## 2017-01-12 ENCOUNTER — Emergency Department (HOSPITAL_COMMUNITY)
Admission: EM | Admit: 2017-01-12 | Discharge: 2017-01-12 | Disposition: A | Discharge status: Home / Self Care | Payer: Self-pay | Attending: Emergency Medicine | Admitting: Emergency Medicine

## 2017-01-12 DIAGNOSIS — R0789 Other chest pain: Secondary | ICD-10-CM | POA: Insufficient documentation

## 2017-01-12 DIAGNOSIS — Y998 Other external cause status: Secondary | ICD-10-CM | POA: Insufficient documentation

## 2017-01-12 DIAGNOSIS — W2209XA Striking against other stationary object, initial encounter: Secondary | ICD-10-CM | POA: Insufficient documentation

## 2017-01-12 DIAGNOSIS — F17200 Nicotine dependence, unspecified, uncomplicated: Secondary | ICD-10-CM | POA: Insufficient documentation

## 2017-01-12 DIAGNOSIS — S20212A Contusion of left front wall of thorax, initial encounter: Secondary | ICD-10-CM | POA: Insufficient documentation

## 2017-01-12 DIAGNOSIS — Y929 Unspecified place or not applicable: Secondary | ICD-10-CM | POA: Insufficient documentation

## 2017-01-12 DIAGNOSIS — Y9339 Activity, other involving climbing, rappelling and jumping off: Secondary | ICD-10-CM | POA: Insufficient documentation

## 2017-01-12 MED ORDER — IBUPROFEN 400 MG PO TABS
ORAL_TABLET | ORAL | Status: AC
  Filled 2017-01-12: qty 1

## 2017-01-12 MED ORDER — IBUPROFEN 600 MG PO TABS: 600.0000 mg | ORAL_TABLET | Freq: Four times a day (QID) | ORAL | 0 refills | Status: DC | PRN

## 2017-01-12 MED ORDER — LIDOCAINE 5 % EX PTCH
1.0000 | MEDICATED_PATCH | CUTANEOUS | Status: DC
  Administered 2017-01-12: 1 via TRANSDERMAL
  Filled 2017-01-12: qty 1

## 2017-01-12 MED ORDER — IBUPROFEN 400 MG PO TABS
400.0000 mg | ORAL_TABLET | Freq: Once | ORAL | Status: AC | PRN
  Administered 2017-01-12: 400 mg via ORAL

## 2017-01-12 NOTE — ED Provider Notes (Signed)
[facility] Provider Note   CSN: [id number] Arrival date & time: 01/12/17  0714     History   Chief Complaint Chief Complaint  Patient presents with  . Chest Pain    HPI Todd Christian is a 46 y.o. male.  The history is provided by the patient.  Chest Pain   This is a new problem. The current episode started yesterday. The problem occurs constantly. The problem has not changed since onset.The pain is associated with movement, raising an arm and breathing. The pain is present in the lateral region. The pain is moderate. The quality of the pain is described as sharp. Pertinent negatives include no abdominal pain, no fever and no shortness of breath. He has tried nothing for the symptoms. The treatment provided no relief.   46 year old male who presents with left chest wall pain. He has no significant past medical history. Reports that he was scaling a fence yesterday and evening, when he accidentally slipped and the ribs hit the top of the fence. Since then he has been having left anterior chest wall pain, worse with movement, deep breathing, and palpation. Denies any shortness of breath, fever, cough. Denies a fall. Denies any other injuries. Has not tried any treatments prior to arrival.   History reviewed. No pertinent past medical history.  There are no active problems to display for this patient.   History reviewed. No pertinent surgical history.     Home Medications    Prior to Admission medications   Medication Sig Start Date End Date Taking? Authorizing Provider  fluticasone (FLONASE) 50 MCG/ACT nasal spray Place 2 sprays into both nostrils daily. 09/27/16   Eustace MooreMurray, Laura W, MD  ibuprofen (ADVIL,MOTRIN) 600 MG tablet Take 1 tablet (600 mg total) by mouth every 6 (six) hours as needed for mild pain or moderate pain. 01/12/17   Lavera GuiseLiu, Gulianna Hornsby Duo, MD  predniSONE (DELTASONE) 50 MG tablet Take 1 tablet (50 mg total) by mouth daily. 09/27/16   Eustace MooreMurray, Laura W, MD    triamcinolone (NASACORT AQ) 55 MCG/ACT AERO nasal inhaler Place 2 sprays into the nose daily. 09/27/16   Eustace MooreMurray, Laura W, MD    Family History No family history on file.  Social History Social History  Substance Use Topics  . Smoking status: Current Every Day Smoker    Packs/day: 0.50  . Smokeless tobacco: Never Used  . Alcohol use Yes     Allergies   Patient has no known allergies.   Review of Systems Review of Systems  Constitutional: Negative for fever.  Respiratory: Negative for shortness of breath.   Cardiovascular: Positive for chest pain.  Gastrointestinal: Negative for abdominal pain.  Skin: Negative for wound.  Allergic/Immunologic: Negative for immunocompromised state.  Hematological: Does not bruise/bleed easily.     Physical Exam Updated Vital Signs BP (!) 135/101   Pulse 95   Temp 97.8 F (36.6 C) (Oral)   Resp 17   Ht 5\' 9"  (1.753 m)   Wt 81.6 kg (180 lb)   SpO2 97%   BMI 26.58 kg/m   Physical Exam Physical Exam  Nursing note and vitals reviewed. Constitutional: Well developed, well nourished, non-toxic, and in no acute distress Head: Normocephalic and atraumatic.  Mouth/Throat: Oropharynx is clear and moist.  Neck: Normal range of motion. Neck supple.  Cardiovascular: Normal rate and regular rhythm.   Pulmonary/Chest: Effort normal and breath sounds normal.  left anterior chest wall pain to palpation. Abdominal: Soft. There is no tenderness. There is  no rebound and no guarding.  Musculoskeletal: Normal range of motion.  Neurological: Alert, no facial droop, fluent speech, moves all extremities symmetrically Skin: Skin is warm and dry.  Psychiatric: Cooperative   ED Treatments / Results  Labs (all labs ordered are listed, but only abnormal results are displayed) Labs Reviewed - No data to display  EKG  EKG Interpretation None       Radiology Dg Chest 2 View  Result Date: 01/12/2017 CLINICAL DATA:  Bruised area over the chest  after falling while jumping over a fence last night. Current smoker. EXAM: CHEST  2 VIEW COMPARISON:  PA and lateral chest x-ray of May 20, 2015 FINDINGS: The lungs are well-expanded and clear. There is no pleural effusion or pneumothorax. The heart and pulmonary vascularity are normal. The mediastinum is normal in width. The trachea is midline. There is gentle S shaped thoracolumbar curvature which appears stable. No compression fracture of the thoracic vertebral bodies is observed. The observed ribs are intact. IMPRESSION: No acute post traumatic injury of the thorax is observed. No acute cardiopulmonary abnormality. Electronically Signed   By: David  SwazilandJordan M.D.   On: 01/12/2017 07:45    Procedures Procedures (including critical care time)  Medications Ordered in ED Medications  ibuprofen (ADVIL,MOTRIN) 400 MG tablet (not administered)  lidocaine (LIDODERM) 5 % 1 patch (not administered)  ibuprofen (ADVIL,MOTRIN) tablet 400 mg (400 mg Oral Given 01/12/17 0724)     Initial Impression / Assessment and Plan / ED Course  I have reviewed the triage vital signs and the nursing notes.  Pertinent labs & imaging results that were available during my care of the patient were reviewed by me and considered in my medical decision making (see chart for details).     46 year old male who presents with left chest wall pain after falling against the top of the fence yesterday evening while climbing it. He is well-appearing in no acute distress. Vitals are stable. X-rays visualized and there is no obvious rib fracture. Suspect bruised ribs versus muscle soreness. I discussed supportive care management with patient. Strict return and follow-up instructions reviewed. She expressed understanding of all discharge instructions and felt comfortable with the plan of care.    Final Clinical Impressions(s) / ED Diagnoses   Final diagnoses:  Chest wall pain  Contusion of rib on left side, initial encounter      New Prescriptions New Prescriptions   IBUPROFEN (ADVIL,MOTRIN) 600 MG TABLET    Take 1 tablet (600 mg total) by mouth every 6 (six) hours as needed for mild pain or moderate pain.     Lavera GuiseLiu, Vashti Bolanos Duo, MD 01/12/17 [phone]

## 2017-01-12 NOTE — Discharge Instructions (Signed)
Your Chest x-ray does not show broken ribs. You likely have bruised ribs and muscles. Take ibuprofen and tylenol, and apply heating pack. This will take 1-2 weeks to get better. Return for worsening symptoms, including fever, coughing, difficulty breathing, or any other symptoms concerning to you.

## 2017-01-12 NOTE — ED Triage Notes (Signed)
Pt was climbing a fence last night and slipped and his ribs hit the top of the fence.  L ant rib pain.

## 2018-02-01 IMAGING — DX DG CHEST 2V
2 series · 2 of 2 positions shown · non-contrast
Comparison: PA and lateral chest x-ray May 20, 2015

CLINICAL DATA: Bruised area over the chest after falling while
jumping over a fence last night. Current smoker.

EXAM:
CHEST  2 VIEW

[chest pa]
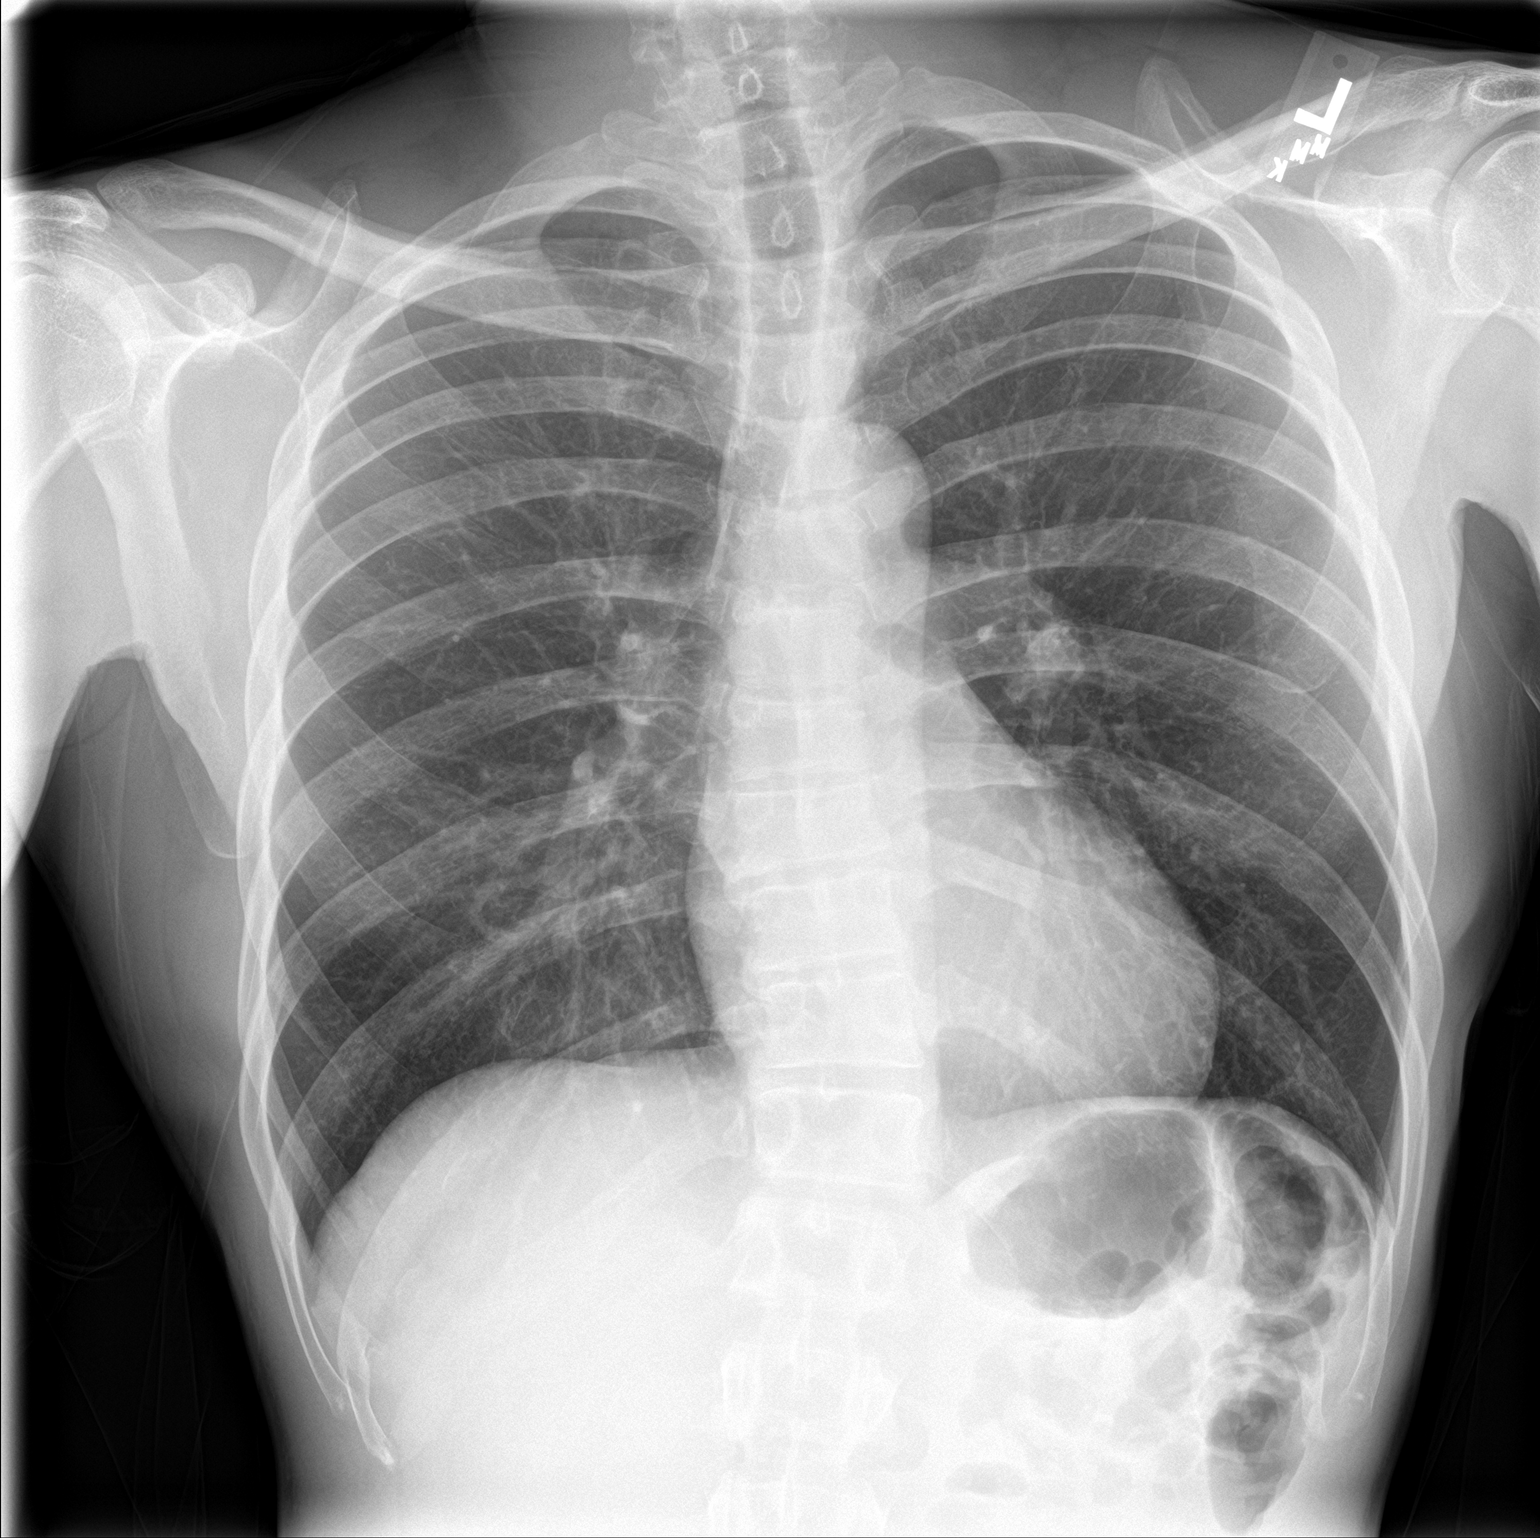

[chest lat]
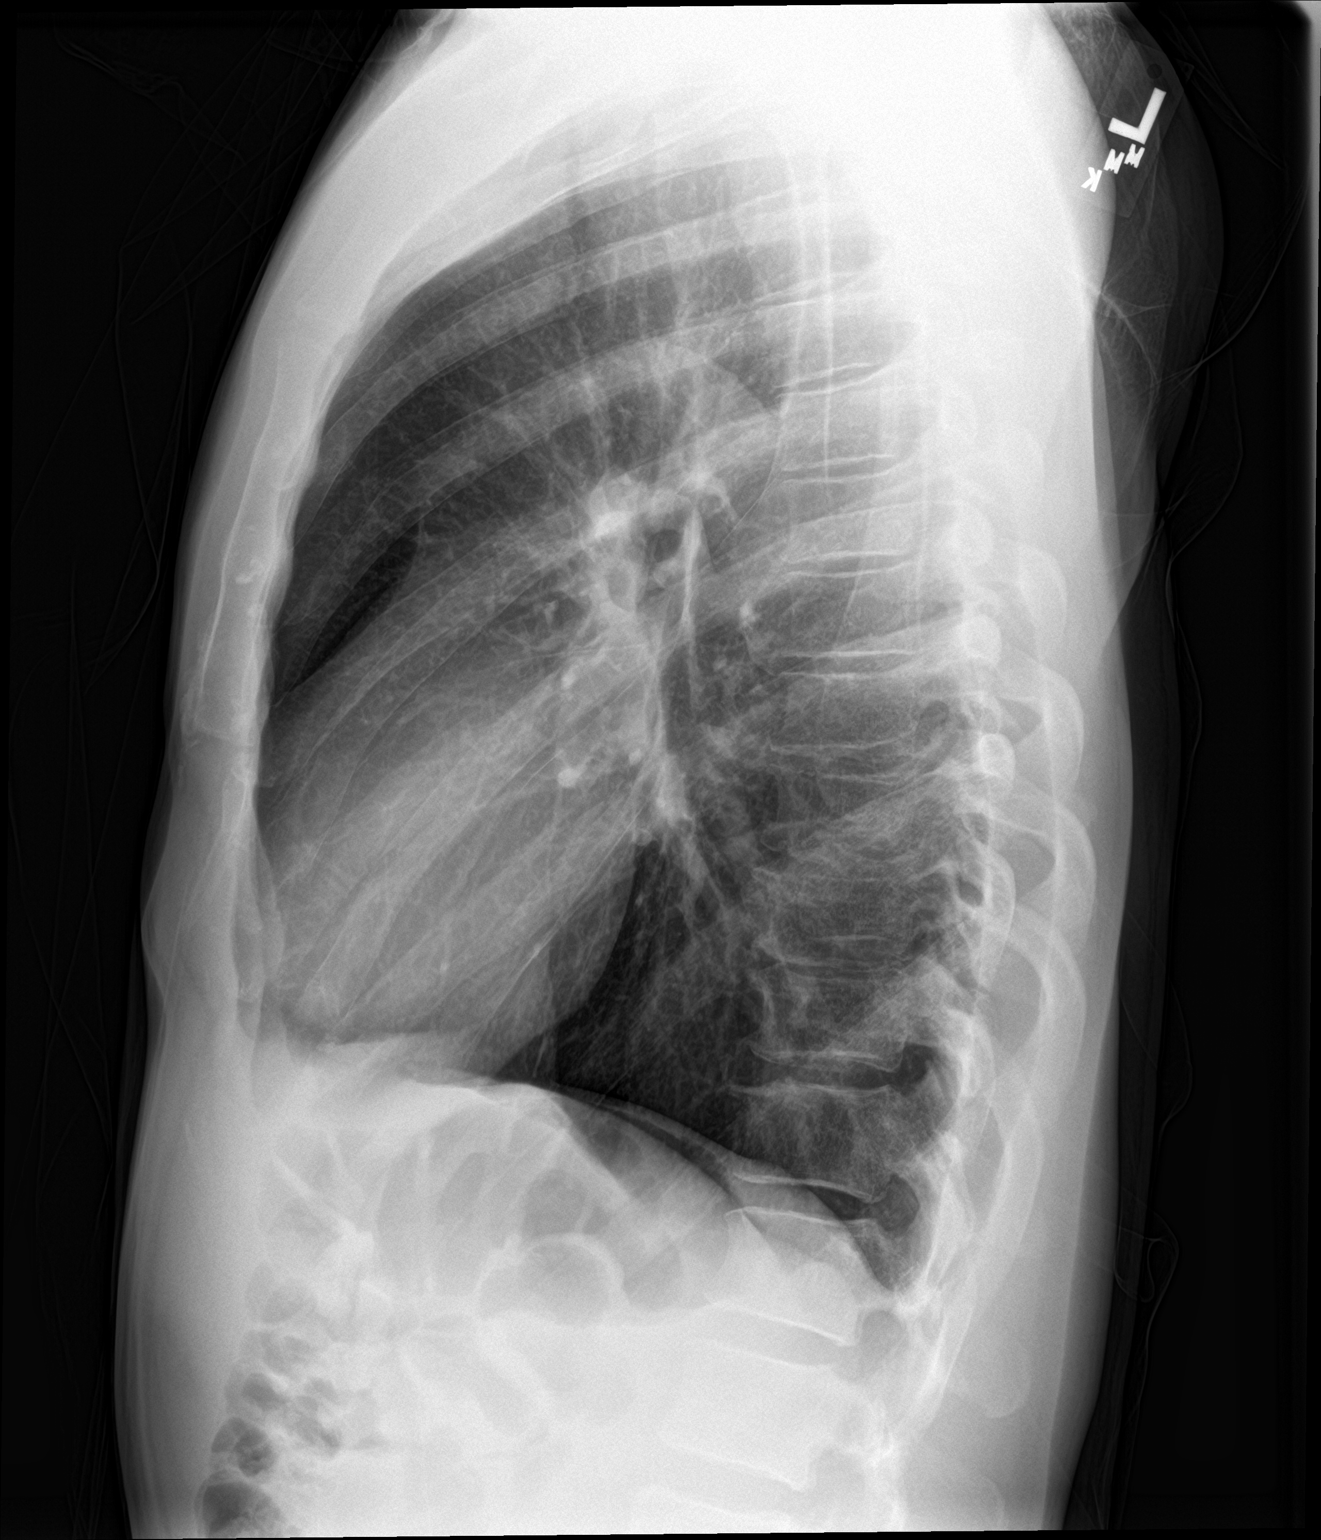

[2 of 2 positions shown; findings below may reference images not displayed]

FINDINGS: The lungs are well-expanded and clear. There is no pleural effusion
or pneumothorax. The heart and pulmonary vascularity are normal. The
mediastinum is normal in width. The trachea is midline. There is
gentle S shaped thoracolumbar curvature which appears stable. No
compression fracture of the thoracic vertebral bodies is observed.
The observed ribs are intact.
IMPRESSION: No acute post traumatic injury of the thorax is observed. No acute
cardiopulmonary abnormality.

## 2020-11-20 ENCOUNTER — Other Ambulatory Visit: Payer: Self-pay | Admitting: Gastroenterology

## 2020-11-20 DIAGNOSIS — R634 Abnormal weight loss: Secondary | ICD-10-CM

## 2021-01-20 ENCOUNTER — Other Ambulatory Visit: Payer: Self-pay

## 2022-03-21 ENCOUNTER — Ambulatory Visit (INDEPENDENT_AMBULATORY_CARE_PROVIDER_SITE_OTHER): Payer: PRIVATE HEALTH INSURANCE

## 2022-03-21 ENCOUNTER — Ambulatory Visit (HOSPITAL_COMMUNITY)
Admission: EM | Admit: 2022-03-21 | Discharge: 2022-03-21 | Disposition: A | Discharge status: Home / Self Care | Payer: PRIVATE HEALTH INSURANCE | Attending: Urgent Care | Admitting: Urgent Care

## 2022-03-21 ENCOUNTER — Encounter (HOSPITAL_COMMUNITY): Payer: Self-pay

## 2022-03-21 DIAGNOSIS — R030 Elevated blood-pressure reading, without diagnosis of hypertension: Secondary | ICD-10-CM | POA: Diagnosis not present

## 2022-03-21 DIAGNOSIS — S92355A Nondisplaced fracture of fifth metatarsal bone, left foot, initial encounter for closed fracture: Secondary | ICD-10-CM

## 2022-03-21 MED ORDER — KETOROLAC TROMETHAMINE 10 MG PO TABS: 10.0000 mg | ORAL_TABLET | Freq: Four times a day (QID) | ORAL | 0 refills | Status: AC | PRN

## 2022-03-21 MED ORDER — KETOROLAC TROMETHAMINE 60 MG/2ML IM SOLN
60.0000 mg | Freq: Once | INTRAMUSCULAR | Status: AC
  Administered 2022-03-21: 60 mg via INTRAMUSCULAR

## 2022-03-21 MED ORDER — KETOROLAC TROMETHAMINE 60 MG/2ML IM SOLN
INTRAMUSCULAR | Status: AC
  Filled 2022-03-21: qty 2
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
## 2022-03-21 NOTE — Discharge Instructions (Addendum)
You have a fracture of your fifth metatarsal bone. Please wear this shoe provided today. Please do not bear weight and use crutches. You must follow-up with orthopedics for reevaluation this coming week. You may take the pain medication prescribed today to help with the swelling and inflammation. Please monitor your blood pressure at home as your pain level improves as it is extremely elevated. Goal blood pressure is 120/80.
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
## 2024-04-11 ENCOUNTER — Other Ambulatory Visit: Payer: Self-pay

## 2024-04-11 ENCOUNTER — Ambulatory Visit (INDEPENDENT_AMBULATORY_CARE_PROVIDER_SITE_OTHER)

## 2024-04-11 ENCOUNTER — Encounter (HOSPITAL_COMMUNITY): Payer: Self-pay | Admitting: Emergency Medicine

## 2024-04-11 ENCOUNTER — Ambulatory Visit (HOSPITAL_COMMUNITY)
Admission: EM | Admit: 2024-04-11 | Discharge: 2024-04-11 | Disposition: A | Discharge status: Home / Self Care | Attending: Family Medicine | Admitting: Family Medicine

## 2024-04-11 DIAGNOSIS — M722 Plantar fascial fibromatosis: Secondary | ICD-10-CM

## 2024-04-11 DIAGNOSIS — M79671 Pain in right foot: Secondary | ICD-10-CM | POA: Diagnosis not present

## 2024-04-11 DIAGNOSIS — R030 Elevated blood-pressure reading, without diagnosis of hypertension: Secondary | ICD-10-CM

## 2024-04-11 NOTE — ED Triage Notes (Signed)
 Pain onset Monday morning.  Reports taking a step and having significant pain in bottom of foot.  Soaked in epson salts.  Has not taken any medications.  Points specifically to instep of right foot as location of pain

## 2024-04-11 NOTE — ED Provider Notes (Signed)
 [facility]   [id number] 04/11/24 Arrival Time: 1216     History              Chief Complaint    Chief Complaint  Patient presents with   Foot Pain      HPI FESTUS PURSEL is a 53 y.o. male.   Pt with a non-contributory medical history presents with R foot pain that was noticed upon getting out of bed on Monday morning (10/20). The pain is located on the bottom of his midfoot to the heel and is described as a sharp, stabbing pain. He denies any injuries and does not recall doing any strenuous activity in the days before the pain. He has been wearing a new pair of work boots for the past 2 months.   The history is provided by the patient.  Foot Pain      History reviewed. No pertinent past medical history.       There are no active problems to display for this patient.     History reviewed. No pertinent surgical history.             Home Medications                Prior to Admission medications   Not on File      Family History History reviewed. No pertinent family history.       Social History Social History  Social History         Tobacco Use   Smoking status: Every Day      Current packs/day: 0.50      Types: Cigarettes   Smokeless tobacco: Never  Vaping Use   Vaping status: Never Used  Substance Use Topics   Alcohol use: Yes   Drug use: No          Allergies              Patient has no known allergies.     Review of Systems Review of Systems  All other systems reviewed and are negative.      Physical Exam Updated Vital Signs BP (!) 158/101 (BP Location: Left Arm)   Pulse 80   Temp 97.8 F (36.6 C) (Oral)   Resp 18   SpO2 99%    Physical Exam Constitutional:      Appearance: Normal appearance.  Cardiovascular:     Rate and Rhythm: Normal rate and regular rhythm.     Pulses: Normal pulses.     Heart sounds: Normal heart sounds.  Pulmonary:     Effort: Pulmonary effort is normal.     Breath sounds: Normal  breath sounds.  Musculoskeletal:     Right foot: Tenderness (Area of tenderness on the plantar surface from the arch to the heel with no signs of trauma present.) present.     Left foot: Normal.  Neurological:     Mental Status: He is alert.         ED Treatments / Results  Labs (all labs ordered are listed, but only abnormal results are displayed) Labs Reviewed - No data to display   EKG   Radiology  Imaging Results (Last 48 hours)  DG Foot Complete Right Result Date: 04/11/2024 EXAM: 3 OR MORE VIEW(S) XRAY OF THE RIGHT FOOT 04/11/2024 01:49:44 PM COMPARISON: None available. CLINICAL HISTORY: acute medial midfoot pain with stepping; today. Table formatting from the original note was not included.; Pain onset Monday morning. Reports taking a step and having  significant pain in bottom of foot. Soaked in epson salts. Has not taken any medications. Points specifically to instep of right foot as location of pain FINDINGS: BONES AND JOINTS: Linear ossific density along the lateral aspect of the anterior process of the calcaneus. This is nonspecific but may reflect sequelae of avulsion injury. No joint dislocation. SOFT TISSUES: The soft tissues are unremarkable. IMPRESSION: 1. Linear ossific density along the lateral anterior calcaneal process, which may represent sequelae of age indeterminate avulsion injury. 2. Examination is otherwise normal. Electronically signed by: Waddell Calk MD 04/11/2024 02:21 PM EDT RP Workstation: [id number]       Procedures Procedures (including critical care time)   Medications Ordered in ED Medications - No data to display     Initial Impression / Assessment and Plan / ED Course  I have reviewed the triage vital signs and the nursing notes.  I have personally viewed and independently interpreted the imaging studies ordered this visit. R foot: no acute bony changes appreciated.   Pertinent labs & imaging results that were available during my care of  the patient were reviewed by me and considered in my medical decision making (see chart for details).     Plantar Fascitis Symptoms consistent with plantar fascitis of the R foot likely related to the new foot wear and lack of arch support. Counseled pt on naproxen 220 mg BID PRN for pain and to utilize orthotic inserts with arch support. Provided pt with contact for podiatry if symptoms do not resolve.    HTN Pt has been noted to have elevated BP this visit and on previous visits over the past several years. Encouraged pt to establish care with PCP for workup and management of chronic condition. Pt was agreeable to follow up.    Red flag symptoms reviewed and return precautions given.    Final Clinical Impressions(s) / ED Diagnoses    Final diagnoses:  Right foot pain  Plantar fasciitis of right foot          Rolinda Rogue, MD 04/11/24 1757

## 2024-04-11 NOTE — Medical Student Note (Signed)
 [facility] Insurance account manager Note For educational purposes for Medical, PA and NP students only and not part of the legal medical record.   CSN: [id number] Arrival date & time: 04/11/24  1216      History   Chief Complaint Chief Complaint  Patient presents with   Foot Pain    HPI Todd Christian is a 53 y.o. male.  Pt with a non-contributory medical history presents with R foot pain that was noticed upon getting out of bed on Monday morning (10/20). The pain is located on the bottom of his midfoot to the heel and is described as a sharp, stabbing pain. He denies any injuries and does not recall doing any strenuous activity in the days before the pain. He has been wearing a new pair of work boots for the past 2 months.   The history is provided by the patient.  Foot Pain    History reviewed. No pertinent past medical history.  There are no active problems to display for this patient.   History reviewed. No pertinent surgical history.     Home Medications    Prior to Admission medications   Not on File    Family History History reviewed. No pertinent family history.  Social History Social History   Tobacco Use   Smoking status: Every Day    Current packs/day: 0.50    Types: Cigarettes   Smokeless tobacco: Never  Vaping Use   Vaping status: Never Used  Substance Use Topics   Alcohol use: Yes   Drug use: No     Allergies   Patient has no known allergies.   Review of Systems Review of Systems  All other systems reviewed and are negative.    Physical Exam Updated Vital Signs BP (!) 158/101 (BP Location: Left Arm)   Pulse 80   Temp 97.8 F (36.6 C) (Oral)   Resp 18   SpO2 99%   Physical Exam Constitutional:      Appearance: Normal appearance.  Cardiovascular:     Rate and Rhythm: Normal rate and regular rhythm.     Pulses: Normal pulses.     Heart sounds: Normal heart sounds.  Pulmonary:     Effort: Pulmonary effort is  normal.     Breath sounds: Normal breath sounds.  Musculoskeletal:     Right foot: Tenderness (Area of tenderness on the plantar surface from the arch to the heel with no signs of trauma present.) present.     Left foot: Normal.  Neurological:     Mental Status: He is alert.      ED Treatments / Results  Labs (all labs ordered are listed, but only abnormal results are displayed) Labs Reviewed - No data to display  EKG  Radiology DG Foot Complete Right Result Date: 04/11/2024 EXAM: 3 OR MORE VIEW(S) XRAY OF THE RIGHT FOOT 04/11/2024 01:49:44 PM COMPARISON: None available. CLINICAL HISTORY: acute medial midfoot pain with stepping; today. Table formatting from the original note was not included.; Pain onset Monday morning. Reports taking a step and having significant pain in bottom of foot. Soaked in epson salts. Has not taken any medications. Points specifically to instep of right foot as location of pain FINDINGS: BONES AND JOINTS: Linear ossific density along the lateral aspect of the anterior process of the calcaneus. This is nonspecific but may reflect sequelae of avulsion injury. No joint dislocation. SOFT TISSUES: The soft tissues are unremarkable. IMPRESSION: 1. Linear ossific density along the lateral  anterior calcaneal process, which may represent sequelae of age indeterminate avulsion injury. 2. Examination is otherwise normal. Electronically signed by: Waddell Calk MD 04/11/2024 02:21 PM EDT RP Workstation: [id number]    Procedures Procedures (including critical care time)  Medications Ordered in ED Medications - No data to display   Initial Impression / Assessment and Plan / ED Course  I have reviewed the triage vital signs and the nursing notes.  Pertinent labs & imaging results that were available during my care of the patient were reviewed by me and considered in my medical decision making (see chart for details).     Plantar Fascitis Symptoms consistent with  plantar fascitis of the R foot likely related to the new foot wear and lack of arch support. Counseled pt on naproxen 220 mg BID PRN for pain and to utilize orthotic inserts with arch support. Provided pt with contact for podiatry if symptoms do not resolve.   HTN Pt has been noted to have elevated BP this visit and on previous visits over the past several years. Encouraged pt to establish care with PCP for workup and management of chronic condition. Pt was agreeable to follow up.   Red flag symptoms reviewed and return precautions given.   Final Clinical Impressions(s) / ED Diagnoses   Final diagnoses:  Right foot pain  Plantar fasciitis of right foot    New Prescriptions New Prescriptions   No medications on file

## 2024-04-11 NOTE — Discharge Instructions (Addendum)
 Your blood pressure was noted to be elevated during your visit today. If you are currently taking medication for high blood pressure, please ensure you are taking this as directed. If you do not have a history of high blood pressure and your blood pressure remains persistently elevated, you may need to begin taking a medication at some point. You may return here within the next few days to recheck if unable to see your primary care provider or if you do not have a one.  BP (!) 158/101 (BP Location: Left Arm)   Pulse 80   Temp 97.8 F (36.6 C) (Oral)   Resp 18   SpO2 99%   BP Readings from Last 3 Encounters:  04/11/24 (!) 158/101  03/21/22 (!) 171/117  01/12/17 (!) 138/105
